# Patient Record
Sex: Male | Born: 1975 | Race: White | Hispanic: No
Health system: Southern US, Community
[De-identification: ages and names within clinical notes are randomized; demographics above are authoritative.]

---

## 2004-05-10 ENCOUNTER — Emergency Department: Payer: Self-pay | Admitting: Emergency Medicine

## 2009-10-08 ENCOUNTER — Emergency Department: Payer: Self-pay | Admitting: Internal Medicine

## 2009-10-16 ENCOUNTER — Ambulatory Visit: Payer: Self-pay | Admitting: Urology

## 2011-03-04 IMAGING — CT CT ABD-PELV W/O CM
1 of 2 series · 16 of 32 positions shown, 20 images · non-contrast
Comparison: none

REASON FOR EXAM: right renal colic
COMMENTS:

[Series 2: stone · axial · 0.81mm/px · z∈[+308,+728]mm · 16 of 154 slices shown, 20 images]
[im 7/154  soft-tissue]
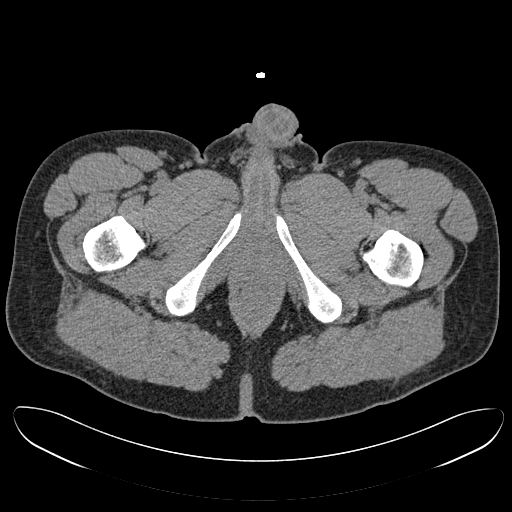
[im 7/154  bone]
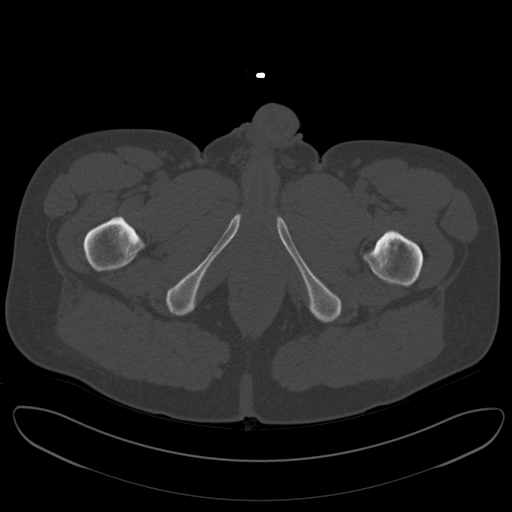
[im 20/154  soft-tissue]
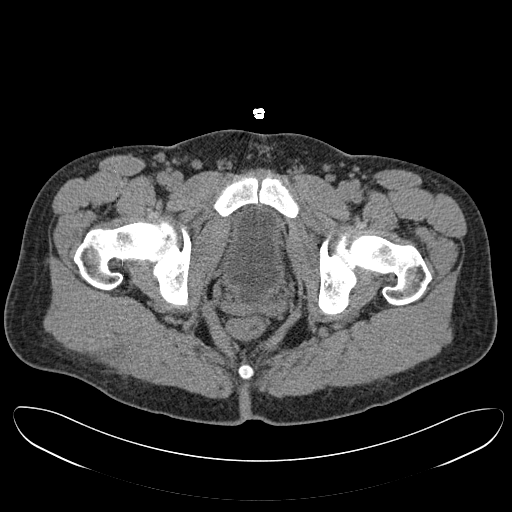
[im 32/154  soft-tissue]
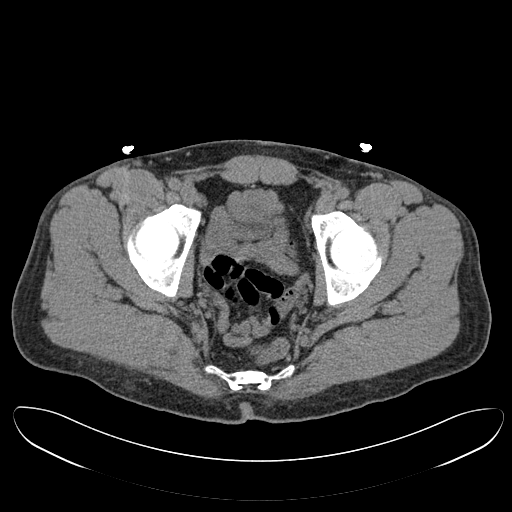
[im 39/154  soft-tissue]
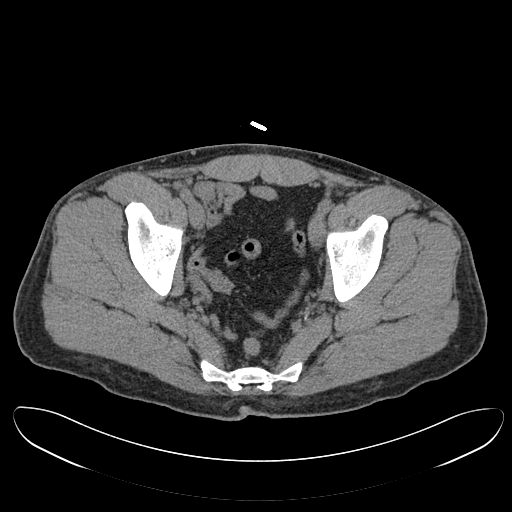
[im 52/154  soft-tissue]
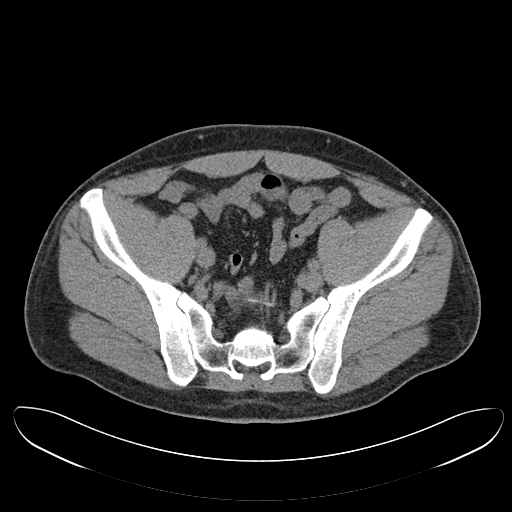
[im 64/154  soft-tissue]
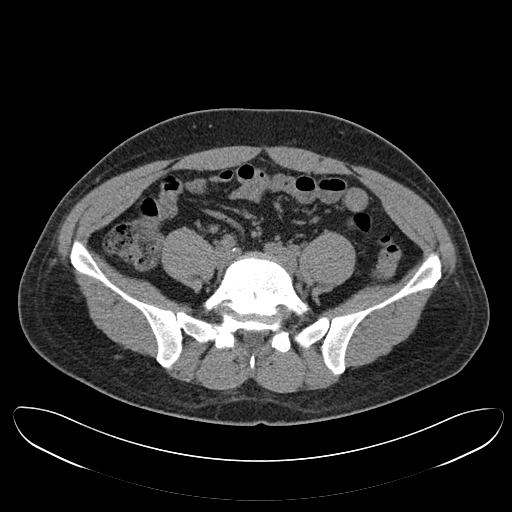
[im 71/154  soft-tissue]
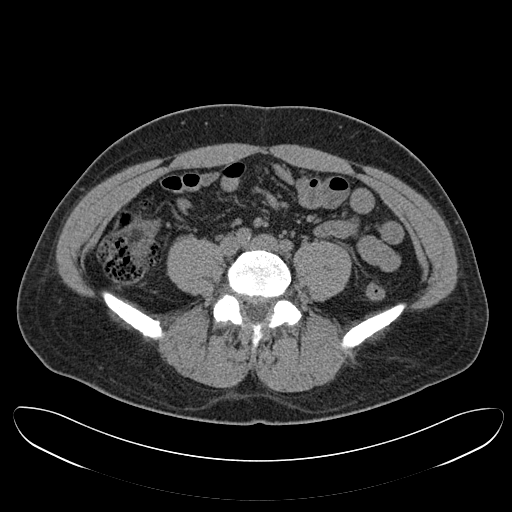
[im 83/154  soft-tissue]
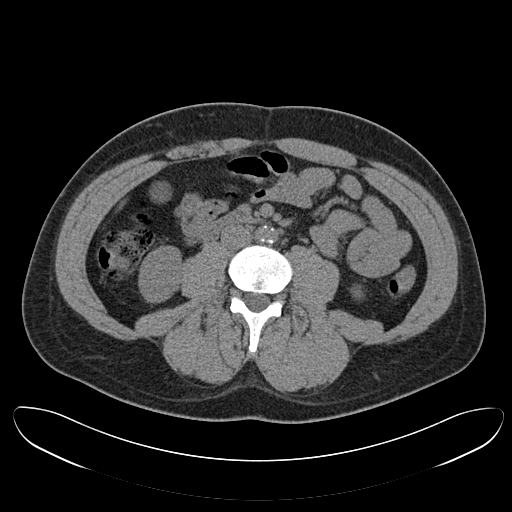
[im 90/154  soft-tissue]
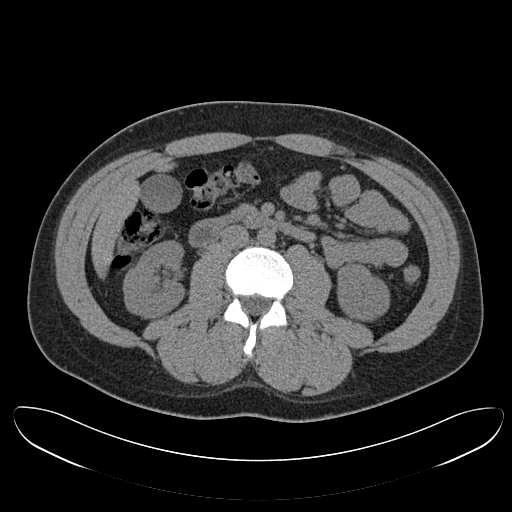
[im 90/154  bone]
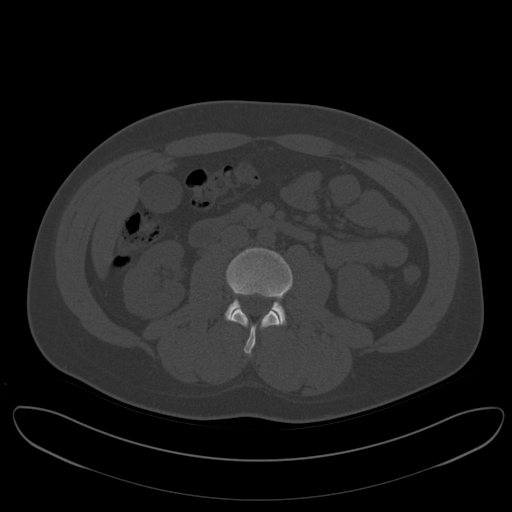
[im 103/154  soft-tissue]
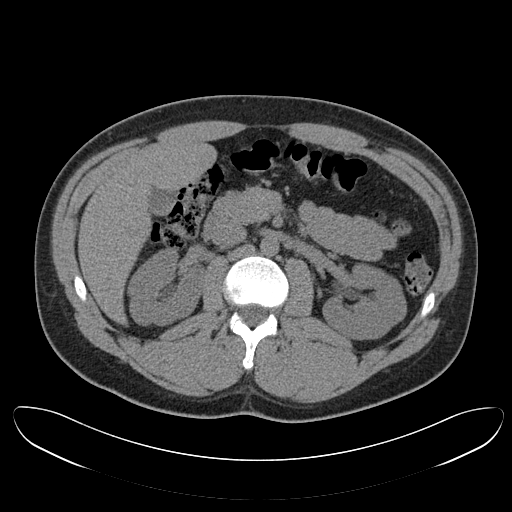
[im 115/154  soft-tissue]
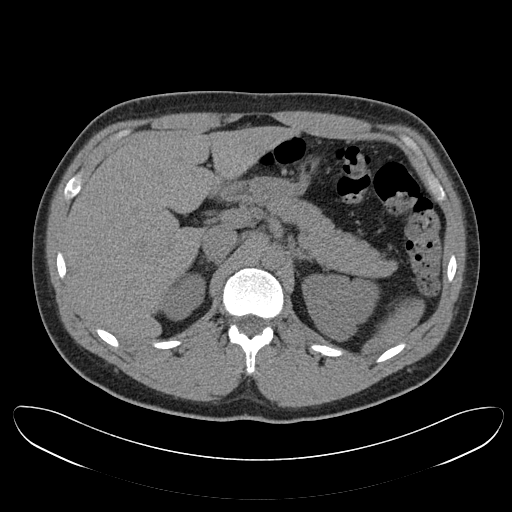
[im 122/154  soft-tissue]
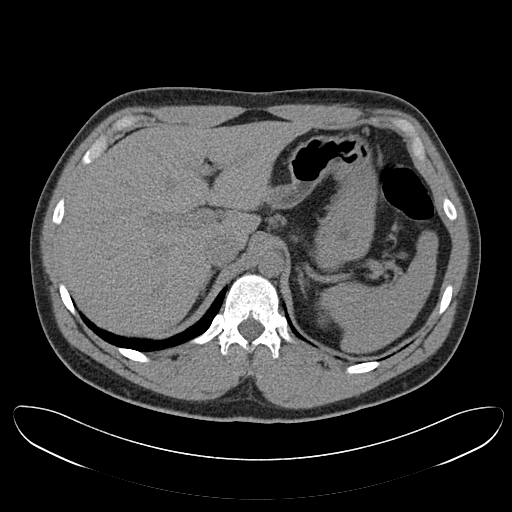
[im 128/154  lung]
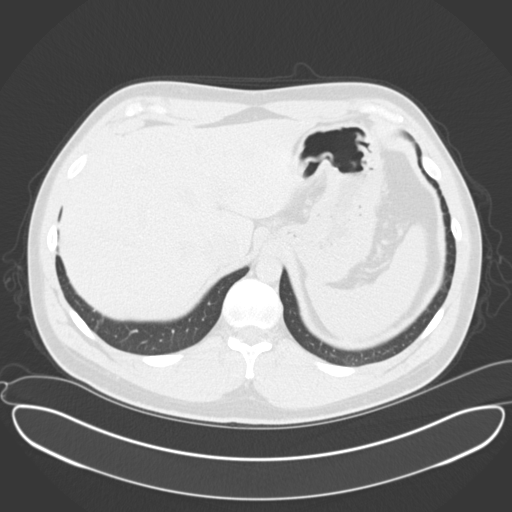
[im 134/154  soft-tissue]
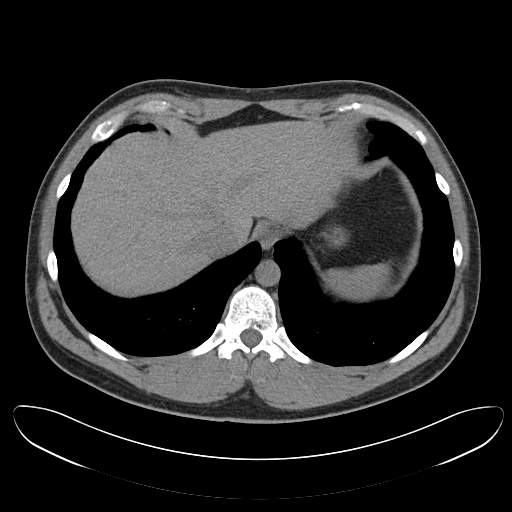
[im 134/154  lung]
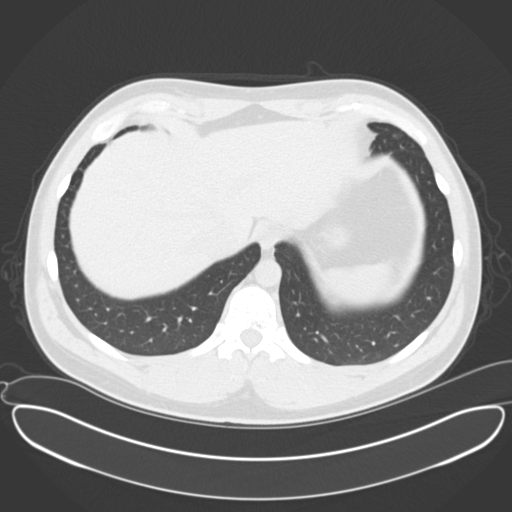
[im 141/154  lung]
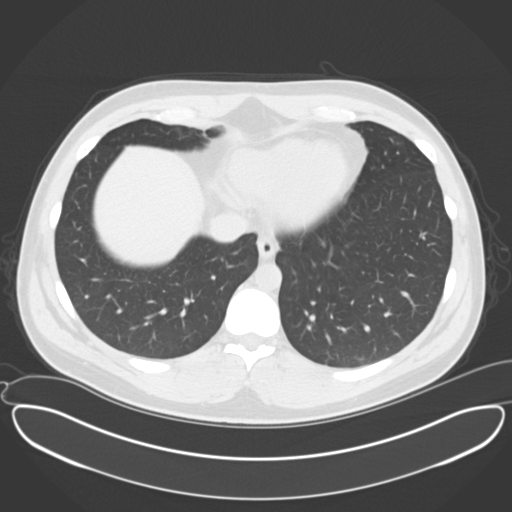
[im 147/154  soft-tissue]
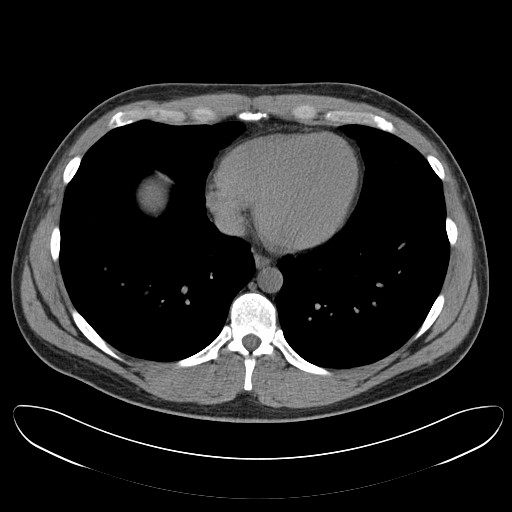
[im 147/154  lung]
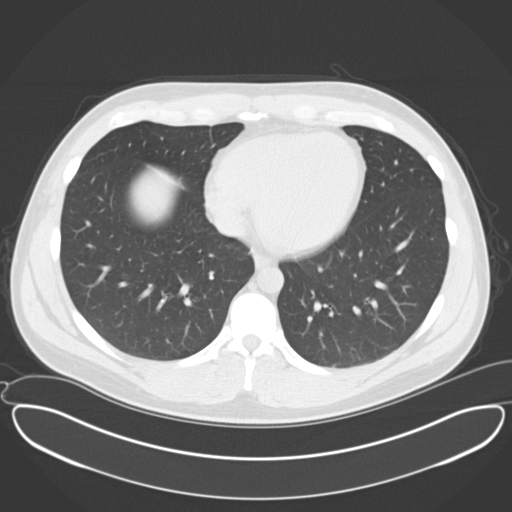

[16 of 32 positions shown; findings below may reference images not displayed]

PROCEDURE:     CT  - CT ABDOMEN AND PELVIS W[DATE]  [DATE]

RESULT:     Non-contrast renal stone protocol CT of the abdomen and pelvis
is reconstructed at 3 mm slice thickness in the axial plane. There is no
previous exam for comparison. No urinary obstruction or urinary tract
calculi are seen. There is limited atherosclerotic calcification. No
radiopaque gallstones are evident. The urinary bladder and prostate appear
unremarkable. Phleboliths are seen in the pelvic region on the left. The
appendix is normal. There is no abnormal bowel distention or bowel wall
thickening evident. The gallbladder, adrenal glands, pancreas, spleen and
liver appear within normal limits. The aorta is normal in caliber.
IMPRESSION: 1.     No urinary obstruction or stones evident.

## 2013-05-14 ENCOUNTER — Emergency Department: Payer: Self-pay | Admitting: Emergency Medicine

## 2013-05-14 LAB — COMPREHENSIVE METABOLIC PANEL
Albumin: 3.6 g/dL (ref 3.4–5.0)
Alkaline Phosphatase: 74 U/L (ref 50–136)
Anion Gap: 3 — ABNORMAL LOW (ref 7–16)
BUN: 8 mg/dL (ref 7–18)
Bilirubin,Total: 0.5 mg/dL (ref 0.2–1.0)
Calcium, Total: 9.2 mg/dL (ref 8.5–10.1)
Co2: 28 mmol/L (ref 21–32)
Creatinine: 0.92 mg/dL (ref 0.60–1.30)
EGFR (Non-African Amer.): >60
Glucose: 99 mg/dL (ref 65–99)
Osmolality: 274 (ref 275–301)
Potassium: 3.5 mmol/L (ref 3.5–5.1)
SGPT (ALT): 19 U/L (ref 12–78)
Sodium: 138 mmol/L (ref 136–145)
Total Protein: 7.3 g/dL (ref 6.4–8.2)

## 2013-05-14 LAB — URINALYSIS, COMPLETE
Bilirubin,UR: NEGATIVE
Glucose,UR: NEGATIVE mg/dL (ref 0–75)
Ketone: NEGATIVE
Ph: 6 (ref 4.5–8.0)
RBC,UR: 11 /HPF (ref 0–5)
Specific Gravity: 1.011 (ref 1.003–1.030)
Squamous Epithelial: NONE SEEN
WBC UR: 1 /HPF (ref 0–5)

## 2013-05-14 LAB — CBC
MCHC: 35 g/dL (ref 32.0–36.0)
MCV: 93 fL (ref 80–100)
WBC: 12.7 10*3/uL — ABNORMAL HIGH (ref 3.8–10.6)

## 2014-09-11 ENCOUNTER — Emergency Department: Payer: Self-pay | Admitting: Emergency Medicine

## 2014-09-11 LAB — CBC
HCT: 50.5 % (ref 40.0–52.0)
HGB: 17 g/dL (ref 13.0–18.0)
MCH: 31.6 pg (ref 26.0–34.0)
MCHC: 33.7 g/dL (ref 32.0–36.0)
MCV: 94 fL (ref 80–100)
PLATELETS: 240 10*3/uL (ref 150–440)
RBC: 5.37 10*6/uL (ref 4.40–5.90)
RDW: 13.1 % (ref 11.5–14.5)
WBC: 12 10*3/uL — ABNORMAL HIGH (ref 3.8–10.6)

## 2014-09-11 LAB — COMPREHENSIVE METABOLIC PANEL
ALBUMIN: 3.5 g/dL (ref 3.4–5.0)
ALK PHOS: 76 U/L (ref 46–116)
Anion Gap: 6 — ABNORMAL LOW (ref 7–16)
BILIRUBIN TOTAL: 0.6 mg/dL (ref 0.2–1.0)
BUN: 8 mg/dL (ref 7–18)
CREATININE: 0.83 mg/dL (ref 0.60–1.30)
Calcium, Total: 9 mg/dL (ref 8.5–10.1)
Chloride: 102 mmol/L (ref 98–107)
Co2: 28 mmol/L (ref 21–32)
EGFR (African American): >60
EGFR (Non-African Amer.): >60
GLUCOSE: 96 mg/dL (ref 65–99)
Osmolality: 270 (ref 275–301)
Potassium: 4.2 mmol/L (ref 3.5–5.1)
SGOT(AST): 22 U/L (ref 15–37)
SGPT (ALT): 22 U/L (ref 14–63)
Sodium: 136 mmol/L (ref 136–145)
TOTAL PROTEIN: 8 g/dL (ref 6.4–8.2)

## 2014-09-11 LAB — URINALYSIS, COMPLETE
BILIRUBIN, UR: NEGATIVE
Bacteria: NONE SEEN
Glucose,UR: NEGATIVE mg/dL (ref 0–75)
LEUKOCYTE ESTERASE: NEGATIVE
Nitrite: NEGATIVE
PROTEIN: NEGATIVE
Ph: 6 (ref 4.5–8.0)
SQUAMOUS EPITHELIAL: NONE SEEN
Specific Gravity: 1.004 (ref 1.003–1.030)
WBC UR: NONE SEEN /HPF (ref 0–5)

## 2014-10-10 ENCOUNTER — Ambulatory Visit: Payer: Self-pay

## 2014-10-29 ENCOUNTER — Ambulatory Visit: Payer: Self-pay | Admitting: Urology

## 2014-11-11 ENCOUNTER — Ambulatory Visit
Admit: 2014-11-11 | Disposition: A | Discharge status: Home / Self Care | Payer: Self-pay | Attending: Urology | Admitting: Urology

## 2014-11-11 LAB — DRUG SCREEN, URINE
Amphetamines, Ur Screen: NEGATIVE
BARBITURATES, UR SCREEN: NEGATIVE
Benzodiazepine, Ur Scrn: NEGATIVE
COCAINE METABOLITE, UR ~~LOC~~: NEGATIVE
Cannabinoid 50 Ng, Ur ~~LOC~~: POSITIVE
MDMA (Ecstasy)Ur Screen: NEGATIVE
Methadone, Ur Screen: NEGATIVE
OPIATE, UR SCREEN: NEGATIVE
Phencyclidine (PCP) Ur S: NEGATIVE
Tricyclic, Ur Screen: NEGATIVE

## 2014-11-29 NOTE — Consult Note (Signed)
PATIENT NAME:  Jason GentileMOORE, Corbet L MR#:  [id number] DATE OF BIRTH:  [birth year]  DATE OF CONSULTATION:  05/14/2013  REFERRING PHYSICIAN:  Dr. Mayford KnifeWilliams.  CONSULTING PHYSICIAN:  Scott C. Stoioff, MD  REASON FOR CONSULTATION:  Right testicular pain with abnormal ultrasound.   PLACE OF CONSULTATION: Emergency Department.   CHIEF COMPLAINT: Right testicular pain.   HISTORY OF PRESENT ILLNESS: A 39 year old white male who had onset of dull right  testicular pain on 05/13/2013. Pain gradually increased in intensity. There were no identifiable precipitating factors. Pain was worse with walking and movement. He denied any testicular trauma.  He does have a history of stone disease although states with previous stones, when he has had testicular pain, the scrotum and testicle was not tender to touch. He denies fever, chills, nausea or vomiting. He has no bothersome lower urinary tract symptoms.   PAST MEDICAL HISTORY:  Recurrent stones.   PAST SURGICAL HISTORY:  Stent placement.   MEDICATIONS ON ADMISSION: None.   ALLERGIES: No known drug allergies.   SOCIAL HISTORY: The patient is an everyday smoker, occasional alcohol use.   REVIEW OF SYSTEMS:  GENERAL:  Denies fevers or chills.   EYES: No visual changes.  HEENT: No headache, nasal congestion.  PULMONARY: No cough, shortness of breath.  CARDIOVASCULAR: No chest pain, palpitations.  GASTROINTESTINAL: No nausea, vomiting.  GENITOURINARY: As per the HPI.   HEMATOLOGY: No history of bleeding or clotting disorders.  PSYCH: No depression, anxiety. Remainder of review of systems otherwise noncontributory.   PHYSICAL EXAMINATION:   VITAL SIGNS:  Temp 97.9, BP 148/81, pulse 86.  GENERAL: The patient is in no acute distress.  ABDOMEN: Soft, nontender.  GU: Phallus without lesions. Testes are descended bilaterally. There is mild tenderness of the right testis. No palpable mass. No evidence of hernia. Prostate exam was deferred.   IMAGING:  Scrotal  sonogram was reviewed. There is an oval hypoechoic area in the right testis measuring approximately 1.4 x 0.9 x 2 cm. There is decreased flow in this hypoechoic area, however, positive flow to the remainder of the right testis. The left testis is a normal in appearance. A renal ultrasound was also performed which suggested the possibility of mild right hydronephrosis. There was also left nephrolithiasis.   URINALYSIS: Yellow, clear, 2+ blood on dipstick, 11 RBCs and 1 WBC on microscopy. Creatinine normal at 0.92.   IMPRESSION:  1.  Right testicular pain. 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 Possibility of a testicular tumor is much lower. There is no indication for surgical intervention at this time, however, this will need to be followed. The possibility of referred pain from a right ureteral stone also needs to be considered based on his ultrasonic findings and microhematuria. For continued pain, will obtain a noncontrast stone protocol CT of the abdomen and pelvis.  2.  Left nephrolithiasis.   RECOMMENDATION:  The patient will be provided with a prescription for pain medication by the Emergency Department physician. He was instructed to contact our office for increasing pain or development of testicular redness or swelling. He will otherwise follow up in approximately 2 weeks.    ____________________________ Verna CzechScott C. Lonna CobbStoioff, MD scs:cs D: 05/14/2013 19:05:20 ET T: 05/14/2013 19:46:30 ET JOB#: [id number]  cc: Lorin PicketScott C. Lonna CobbStoioff, MD, <Dictator> Riki AltesSCOTT C STOIOFF MD ELECTRONICALLY SIGNED 05/23/2013 12:33

## 2014-12-02 LAB — SURGICAL PATHOLOGY

## 2014-12-08 NOTE — Op Note (Signed)
PATIENT NAME:  Jason Briggs, Jason Briggs MR#:  [id number] DATE OF BIRTH:  10-16-1975  DATE OF PROCEDURE:  11/11/2014  PREOPERATIVE DIAGNOSIS:  Microscopic hematuria.   POSTOPERATIVE DIAGNOSES:  1.  Microscopic hematuria.  2.  Bladder lesion.   PROCEDURE PERFORMED: Cystoscopy, bilateral retrograde pyelogram, bladder biopsy.   ATTENDING SURGEON: Claris GladdenAshley J. Janaria Mccammon, MD.   ANESTHESIA: General anesthesia.   ESTIMATED BLOOD LOSS: Minimal.   DRAINS: None.   COMPLICATIONS: None.   SPECIMENS: Bladder biopsy.   INDICATION: This is a 39 year old male with a history of microscopic hematuria and a smoking history, who presented today for completion of his hematuria workup including bilateral retrograde pyelogram and cystoscopy. Risks and benefits of the procedure were explained in detail. The patient agreed to proceed as planned.    PROCEDURE: The patient was correctly identified in the preoperative holding area and informed consent was confirmed. He was brought to the operating suite and placed on the table in the supine position. At this time universal timeout protocol was performed. All team members were identified. Venodyne boots were placed and he was administered 400 mg of IV Cipro in the perioperative period. He was then placed under general anesthesia, repositioned lower on the bed in the dorsal lithotomy position, and prepped and draped in standard surgical fashion. At this point in time a rigid cystoscope was advanced per urethra into the bladder. Of note the urethra and prostatic fossa were fairly normal. The bladder was then evaluated with care. When the bladder was fairly distended there was some mild trabeculation noted and at the dome of the bladder there was a very small 0.5 mm washed-out submucosal-appearing lesion with central pallor, which appeared to be perhaps a hemosiderin deposit within the mucosa. The remainder of the bladder was absolutely normal without evidence of lesion, stones, or  tumors. Attention was then turned to the left ureteral orifice which was cannulated using a 5 [place] open-ended ureteral catheter. A retrograde pyelogram was performed. This revealed a delicate -appearing ureter all the way up to the level of the renal pelvis, a normal UPJ and no evidence of hydronephrosis or filling defects throughout the entirety of the collecting system and ureter. Attention was then turned to the right side and same exact procedure was performed. This retrograde pyelogram revealed again no filling defects, a delicate -appearing ureter, and no hydroureteronephrosis. Cold cup biopsy forceps were then used to biopsy this area at the dome which was passed off as bladder lesion, dome. Bugbee electrocautery was used to cauterize the base of the lesion and the area around the lesion to insure that it completely then obliterated/fulgurated. The bladder was then emptied. There was no active bleeding noted and hemostasis was adequate. The patient was then repositioned in the supine position after the scope was removed and reversed from anesthesia.  He was taken to the PACU in stable condition. There were no complications in this case.     ____________________________ Claris GladdenAshley J. Macon Lesesne, MD ajb:bu D: 11/11/2014 16:11:53 ET T: 11/11/2014 19:27:03 ET JOB#: [id number]  cc: Claris GladdenAshley J. Hope Brandenburger, MD, <Dictator> Claris GladdenASHLEY J Ashna Dorough MD ELECTRONICALLY SIGNED 11/19/2014 18:13

## 2016-02-11 ENCOUNTER — Other Ambulatory Visit: Payer: Self-pay | Admitting: Orthopedic Surgery

## 2016-02-11 DIAGNOSIS — M5441 Lumbago with sciatica, right side: Secondary | ICD-10-CM

## 2016-02-21 ENCOUNTER — Ambulatory Visit: Admission: RE | Admit: 2016-02-21 | Payer: 59 | Source: Ambulatory Visit

## 2016-03-29 IMAGING — RF DG ABDOMEN 1V
2 series · 2 of 2 positions shown · non-contrast
Comparison: none

[Series 1: pädiatrie · 1 of 1 slices shown (1 of 2)]
[im 1/1]
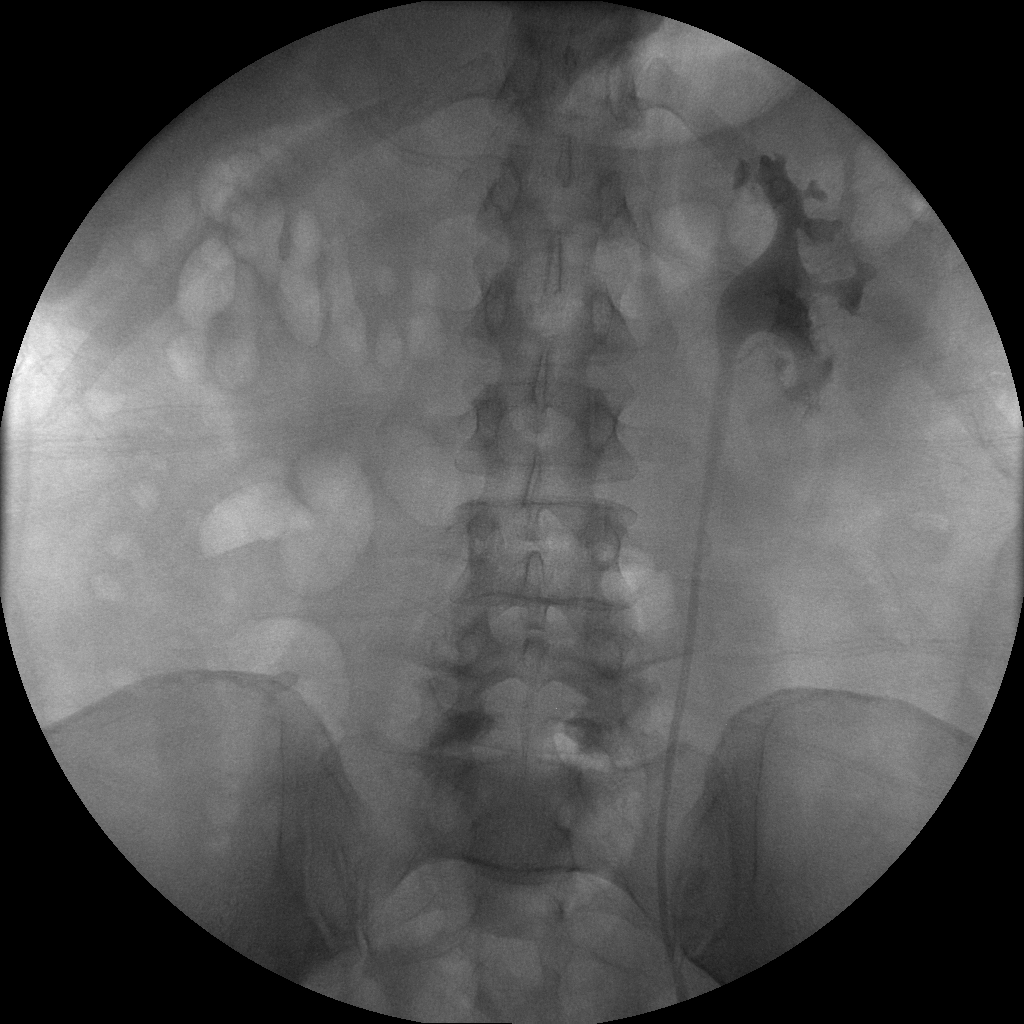

[Series 2: pädiatrie · 1 of 1 slices shown (2 of 2)]
[im 1/1]
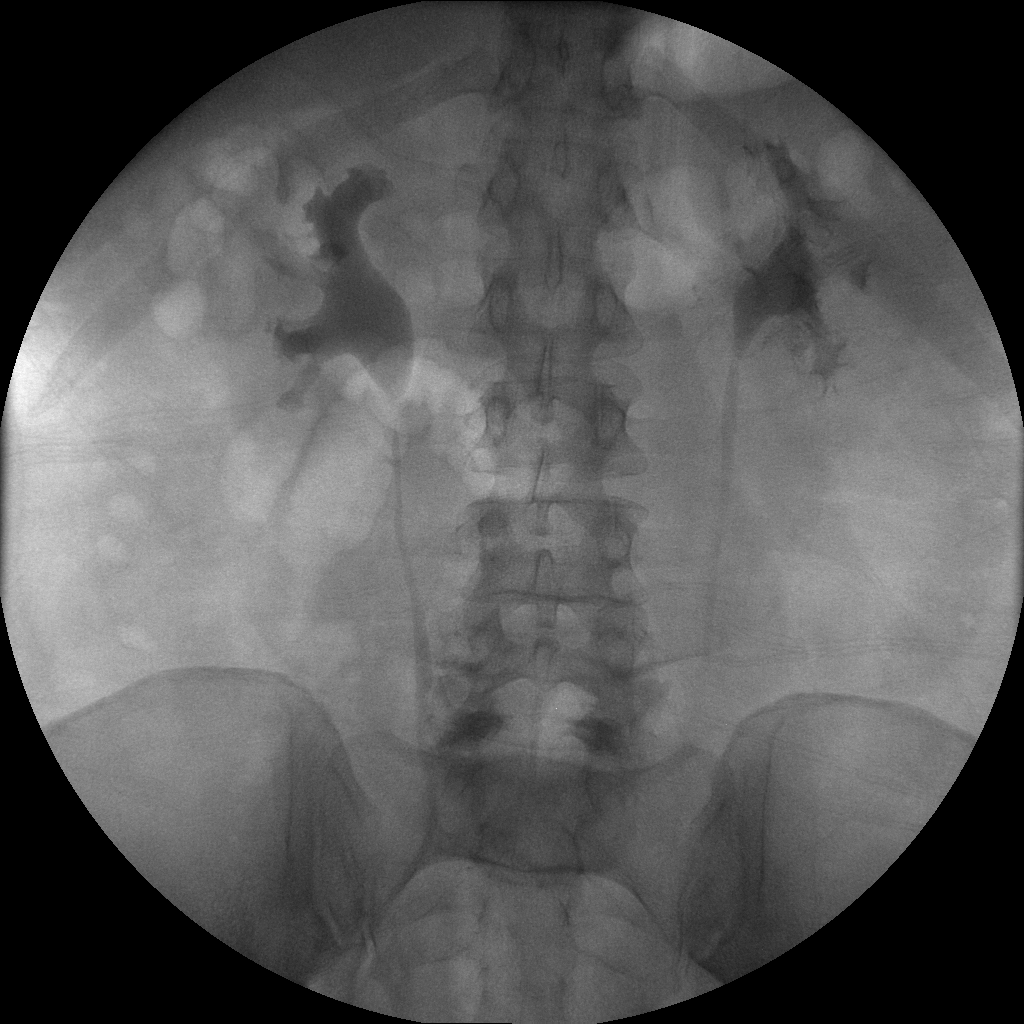

[2 of 2 positions shown; findings below may reference images not displayed]

Canned report from images found in remote index.

Refer to host system for actual result text.
